# Patient Record
Sex: Female | Born: 1945 | Race: White | Hispanic: No | Marital: Married | Smoking: Former smoker
Health system: Southern US, Community
[De-identification: ages and names within clinical notes are randomized; demographics above are authoritative.]

## PROBLEM LIST (undated history)

## (undated) DIAGNOSIS — I10 Essential (primary) hypertension: Secondary | ICD-10-CM

## (undated) DIAGNOSIS — E119 Type 2 diabetes mellitus without complications: Secondary | ICD-10-CM

## (undated) DIAGNOSIS — E079 Disorder of thyroid, unspecified: Secondary | ICD-10-CM

## (undated) HISTORY — PX: ABDOMINAL HYSTERECTOMY: SHX81

---

## 2008-07-06 ENCOUNTER — Observation Stay (HOSPITAL_COMMUNITY): Admission: EM | Admit: 2008-07-06 | Discharge: 2008-07-07 | Payer: Self-pay | Admitting: Emergency Medicine

## 2008-07-06 ENCOUNTER — Ambulatory Visit: Payer: Self-pay | Admitting: Internal Medicine

## 2008-07-06 ENCOUNTER — Ambulatory Visit: Payer: Self-pay | Admitting: Cardiology

## 2008-07-07 ENCOUNTER — Encounter (INDEPENDENT_AMBULATORY_CARE_PROVIDER_SITE_OTHER): Payer: Self-pay | Admitting: Internal Medicine

## 2011-04-18 NOTE — H&P (Signed)
NAME:  Phyllis Chambers, Phyllis Chambers NO.:  [id number]   MEDICAL RECORD NO.:  [id number]          PATIENT TYPE:  EMS   LOCATION:  MAJO                         FACILITY:  [facility]   PHYSICIAN:  Ladell Pier, M.D.   DATE OF BIRTH:  Aug 14, 1946   DATE OF ADMISSION:  07/06/2008  DATE OF DISCHARGE:                              HISTORY & PHYSICAL   CHIEF COMPLAINT:  Tingling in the chest and the left arm.   HISTORY OF PRESENT ILLNESS:  The patient is a 65 year old white female.  She resides in [place], [place].  The patient presented today with  chest pain.  She states is not really a pain; it is just a tingling  sensation on the left side of the chest going down her left arm that  lasted for about a minute.  She had no associated shortness of breath,  no nausea, no vomiting, no diaphoresis.  This has not happened in the  past.  The chest pain tingling occurred while she was on her twelfth  load of laundry.  She just came up from [place] to help her parents,  and she has been cleaning and doing laundry and trying to help them out.  The patient states that it has been a very stressful time in her life  right now trying to help her parents.  During the time that she felt the  pain, then she felt lightheaded also, but she did not pass out.  She  took her blood sugar at the time.  It was 283 after she had eaten, and  in about an hour it went down to about 230.  She had an episode of  diarrhea also that has resolved.   PAST MEDICAL HISTORY:  1. Anxiety.  2. Hypothyroidism.  3. Goiter.  4. TAH-BSO.  5. Tonsillectomy and adenoidectomy.   FAMILY HISTORY:  Mother is alive.  She has diabetes and lung cancer.  Father is 62 years older, and father has diabetes, hypertension and high  cholesterol but no heart disease in her family.   SOCIAL HISTORY:  She smokes about a half a pack of cigarettes per day  for many years.  No alcohol use.  She lives in [place], [place].  Her parents  live here in [place].  She has a stepson.  She is  married.  She is not employed right now.   MEDICATIONS:  1. Citalopram 20 mg; she takes one-quarter daily.  2. Synthroid 88 mcg daily.   ALLERGIES:  None.   REVIEW OF SYSTEMS:  As per stated in HPI.   PHYSICAL EXAMINATION:  VITAL SIGNS:  Temperature 98.2, blood pressure  128/81, pulse of 80, respirations 18, pulse oximetry 99% on room air.  HEENT:  Head normocephalic, atraumatic.  Pupils reactive to light.  Throat without erythema.  CARDIOVASCULAR:  Regular rate and rhythm.  No murmurs, rubs or gallops.  LUNGS:  Clear bilaterally.  No wheezes, rhonchi or rales.  ABDOMEN:  Soft, nontender, nondistended.  Positive bowel sounds.  EXTREMITIES:  Without edema.  There were 2+ DP pulses bilaterally.  NEUROLOGIC:  Nonfocal.   LABORATORY DATA:  Sodium 137, potassium 4.2, chloride 105, CO2 of 105,  glucose 79, BUN 20, creatinine 0.9, lipase of 26.  LFTs are normal.  Urinalysis is normal.  The first set of cardiac markers revealed CK-MB  of 1.3, myoglobin 53, troponin less than 0.05.  Head CT showed no acute  or focal intracranial abnormality.  Chest x-ray shows bronchitic type  changes but no active air space disease.  EKG is normal.   ASSESSMENT/PLAN:  1. Chest pain, rule out.  2. Anxiety.  3. Hypothyroidism.  4. Near syncope.   Will admit the patient to a hospital room to rule out MI with serial  enzymes, EKG, 2-D echo.  Will check TSH and a D-dimer, as well.  Will  continue her on her Synthroid and check for hypothyroidism and continue  citalopram for anxiety.  Will give her a nicotine patch for smoking.      Ladell Pier, M.D.  Electronically Signed     NJ/MEDQ  D:  07/06/2008  T:  07/06/2008  Job:  [id number]

## 2011-04-18 NOTE — Consult Note (Signed)
NAME:  DALYLA, CHUI NO.:  [id number]   MEDICAL RECORD NO.:  [id number]          PATIENT TYPE:  OBV   LOCATION:  2009                         FACILITY:  [facility]   PHYSICIAN:  Bevelyn Buckles. Bensimhon, MDDATE OF BIRTH:  May 28, 1946   DATE OF CONSULTATION:  07/07/2008  DATE OF DISCHARGE:  07/07/2008                                 CONSULTATION   PRIMARY CARE PHYSICIAN:  Nelly Rout, MD, [facility], [place], [place]   PRIMARY CARDIOLOGIST:  New and she will follow up in [place].   CHIEF COMPLAINT:  Chest pain.   HISTORY OF PRESENT ILLNESS:  Ms. Schwimmer is a 65 year old female with no  history of coronary artery disease.  She has been under increased stress  secondary to caring for her aging parents as well as some other issues.  Yesterday, when she got up she stated her head did not feel right.  She had no specific complaints, but feels that her level of  consciousness was slightly altered.  She had a tingling and cramping  feeling in her left shoulder and left chest.  Her symptoms started  without exertion.  She states it reached to 1-2/10.  There was no  shortness of breath, nausea, vomiting or diaphoresis.  There were no  aggravating or alleviating symptoms.  Her symptoms resolved  spontaneously and when they recurred, EMS was called.  She stated that  they checked her blood pressure which was higher than normal and her CBG  was elevated as well.  She was transported to the hospital and admitted  for further evaluation.  Cardiac enzymes are negative and Cardiology was  asked to evaluate her.   PAST MEDICAL HISTORY:  1. Diabetes (diagnosed today).  2. Hyperlipidemia.  3. Ongoing tobacco use.  4. Obesity with a body mass index of 38.3.  5. Hypothyroidism.  6. Gastroesophageal reflux disease.  7. History of anxiety attack.   PAST SURGICAL HISTORY:  She is status post hysterectomy and  tonsillectomy.   ALLERGIES:  No known drug allergies.   MEDICATIONS PRIOR TO ADMISSION:  1. Synthroid 88 mcg a day.  2. Celexa 20 mg one-quarter tab daily.   CURRENT MEDICATIONS:  1. Aspirin 81 mg a day.  2. Celexa 5 mg daily.  3. Lovenox 40 mg subcu daily.  4. Glucotrol 5 mg daily.  5. Synthroid 88 mcg a day.  6. Nicotine patch 21 mg daily.  7. Protonix 40 mg a day.  8. Zocor 40 mg nightly.   SOCIAL HISTORY:  She lives in [place], [place] with her husband  Oswaldo Done.  She is currently unemployed.  She has an approximately 25-pack  year history of tobacco use, but she states she only smokes a few  cigarettes a day currently and denies alcohol or drug abuse.  She does  not exercise regularly and admits that she rarely exerts herself.  She  eats a poor diet.   FAMILY HISTORY:  Her mother is 63 and in poor health with possible  dementia but no heart disease and her father is alive at age  90 and is  increasingly frail, but also with no history of heart disease.  She has  one brother that died of cancer.   REVIEW OF SYSTEMS:  She has had no fevers, chills or sweats.  She has  not had any headaches or vision or hearing loss.  Her symptoms regarding  her head not feeling right had completely resolved.  The chest pain is  described above.  She has some chronic dyspnea on exertion that is not  changed recently.  She does not cough or wheeze.  She states that she  has severe diarrhea with greater than 5 episodes daily for 5 days prior  to admission, but no nausea and vomiting.  She did not see any blood in  diarrhea.  Her reflux symptoms are generally well controlled.  Full 14-  point review of systems is otherwise negative.   PHYSICAL EXAMINATION:  VITAL SIGNS:  Temperature is 97.5, blood pressure  131/79, pulse 75, respiratory rate 18, and O2 saturation 97% on room  air.  GENERAL:  She is a well-developed, well-nourished white female in no  acute distress.  HEENT:  Normal.  NECK:  There is no lymphadenopathy, thyromegaly, bruit or JVD  noted.  CV:  Her heart is regular in rate and rhythm with S1-S2 and no  significant murmur, rub or gallop is noted.  Distal pulses are intact in  all 4 extremities and no femoral bruits are appreciated.  LUNGS:  Have a few rales in the bases, but are essentially clear  bilaterally.  SKIN:  No rashes or lesions are noted.  ABDOMEN:  Soft and nontender with active bowel sounds and no  hepatosplenomegaly by palpation.  EXTREMITIES:  There is no cyanosis, clubbing, or edema noted.  Distal  pulses are intact in all 4 extremities and no femoral bruits are  appreciated.  MUSCULOSKELETAL:  There is no joint deformity or effusions and no spine  or CVA tenderness.  NEURO:  She is alert and oriented.  Cranial nerves II through XII  grossly intact.   Chest X-Ray:  Two-view bronchitic changes with no active airspace  disease.   Head CT:  No acute disease and no focal intracranial abnormality.   EKG:  Sinus rhythm, rate 72 with no acute ischemic changes.   LABORATORY VALUES:  Hemoglobin 12.9, hematocrit 38.3, WBC 6.8, and  platelets 272.  D-dimer less than 0.22.  Sodium 135, potassium 3.9,  chloride 104, CO2 26, BUN 6, creatinine 0.57, glucose 160, hemoglobin  A1c 9.9, total cholesterol 232, triglycerides 84, HDL 44, LDL 171.  CK-  MB and troponin I as well as point-of-care markers all negative for MI.   Echocardiogram:  EF 55-65%, no diagnostic left ventricular regional wall  motion abnormality identified, but the possibility cannot be completely  excluded based on images.  No significant valvular abnormalities.   IMPRESSION:  Ms. Staubs was seen today by Dr. Gala Romney.  Her chest pain  is atypical and her enzymes are negative for myocardial infarction.  An  outpatient Myoview is appropriate to evaluate her for ischemia.  It is  appropriate to have her on aspirin and statin and diabetes control  medications.  She is encouraged to continue to abstain from smoking.  Once she is cleared for  discharge by her primary care physician,  outpatient cardiac followup will be arranged.      Theodore Demark, PA-C      Bevelyn Buckles. Bensimhon, MD  Electronically Signed    RB/MEDQ  D:  07/07/2008  T:  07/08/2008  Job:  [id number]

## 2011-04-21 NOTE — Discharge Summary (Signed)
Phyllis Chambers, COSPER NO.:  [id number]   MEDICAL RECORD NO.:  [id number]          PATIENT TYPE:  OBV   LOCATION:  2009                         FACILITY:  [facility]   PHYSICIAN:  Ladell Pier, M.D.   DATE OF BIRTH:  24-Mar-1946   DATE OF ADMISSION:  07/06/2008  DATE OF DISCHARGE:  07/07/2008                               DISCHARGE SUMMARY   DISCHARGE DIAGNOSES:  1. Chest pain rule out myocardial infarction, enzymes negative.  The      patient will follow up as an outpatient with [facility] for      Myoview stress test.  2. Chronic renal failure.  3. Tobacco abuse.  4. Anxiety.  5. Hypothyroidism.  6. New diabetes.  7. Dyslipidemia.   DISCHARGE MEDICATIONS:  1. Aspirin 81 mg daily.  2. Synthroid 88 mcg daily.  3. Celexa 20 mg quarter tablet daily.  The patient left before getting      new medication for diabetes.   FOLLOWUP APPOINTMENT:  The patient will follow up as an outpatient with  Cardiolite stress test and will also follow up with her primary care  physician.   PROCEDURES:  None.   CONSULTANTS:  Cardiology.   HISTORY OF PRESENT ILLNESS:  The patient is a 65 year old female who  resides in [place], [place].  She presented with chest pain.  She  states it is not really pain.  It is just a tingling sensation on the  left side of her chest going down to left arm that lasted for about a  minute.  She has no associated shortness of breath.  Please see  admission note for remainder of the history.   PAST MEDICAL HISTORY/SOCIAL HISTORY/MEDICATIONS/ALLERGIES/REVIEW OF  SYSTEMS:  See H and P.   PHYSICAL EXAMINATION ON DISCHARGE:  VITAL SIGNS:  Temperature 97.5,  pulse 75, respiratory rate 18, blood pressure 131/79, and pulse ox 97%  on room air.  HEENT:  Head is normocephalic and atraumatic.  Pupils are reactive to  light.  Throat is without erythema.  CARDIAC:  Regular rate and rhythm.  LUNGS:  Clear bilaterally.  ABDOMEN:  Soft and  positive bowel sounds.  EXTREMITIES:  No edema.   HOSPITAL COURSE:  1. Chest pain.  The patient was admitted to the hospital.  Cardiac      enzymes were done that were negative.  D-dimer was negative.  EKG      did not show any acute ST segment elevation or depression.  The      patient was left before she could be seen to be officially      discharge.  She left AMA.  2. Dyslipidemia.  The patient was started on a statin in the hospital,      but did not get prescription for statin to go home withsecondary to      leaving AMA.  She will follow up with her primary care physician to      receive medications for elevated cholesterol.  3. Anxiety.  She was placed on Xanax p.r.n. while in the hospital.  4. New  onset of diabetes.  She was started on Glucotrol, which should      be switched to metformin in an outpatient; however, as mentioned      before, the patient left AMA, so did not get prescription for her      diabetes.  Her hemoglobin A1c during the admission was 9.9.  5. Hypothyroidism.  She will continue with her Synthroid.   DISCHARGE LABORATORY:  D-dimer less than 0.22.  Hemoglobin A1c 9.9.  Cardiac enzymes negative.  Total cholesterol 232, LDL 171, and HDL 44,  TSH 0.454.  Sodium 137, potassium 4.2, chloride 104, CO2 26, glucose  160, BUN 6, and creatinine 0.57.      Ladell Pier, M.D.  Electronically Signed     NJ/MEDQ  D:  08/05/2008  T:  08/06/2008  Job:  [id number]

## 2011-09-01 LAB — URINE CULTURE

## 2011-09-01 LAB — CBC
MCV: 83.6
RBC: 4.57
WBC: 6.8

## 2011-09-01 LAB — URINALYSIS, ROUTINE W REFLEX MICROSCOPIC
Bilirubin Urine: NEGATIVE
Glucose, UA: NEGATIVE
Hgb urine dipstick: NEGATIVE
Protein, ur: NEGATIVE
Specific Gravity, Urine: 1.005

## 2011-09-01 LAB — BASIC METABOLIC PANEL
CO2: 26
Chloride: 104
Creatinine, Ser: 0.57
GFR calc Af Amer: >60

## 2011-09-01 LAB — POCT I-STAT, CHEM 8
BUN: 20
Calcium, Ion: 1.18
Creatinine, Ser: 0.9
Glucose, Bld: 79
TCO2: 23

## 2011-09-01 LAB — B-NATRIURETIC PEPTIDE (CONVERTED LAB): Pro B Natriuretic peptide (BNP): <30

## 2011-09-01 LAB — LIPID PANEL
Cholesterol: 232 — ABNORMAL HIGH
LDL Cholesterol: 171 — ABNORMAL HIGH
Triglycerides: 84

## 2011-09-01 LAB — TSH: TSH: 0.569

## 2011-09-01 LAB — CARDIAC PANEL(CRET KIN+CKTOT+MB+TROPI): Relative Index: INVALID

## 2011-09-01 LAB — HEMOGLOBIN A1C: Mean Plasma Glucose: 275

## 2011-09-01 LAB — D-DIMER, QUANTITATIVE: D-Dimer, Quant: <0.22

## 2011-09-01 LAB — HEPATIC FUNCTION PANEL
Bilirubin, Direct: 0.1
Indirect Bilirubin: 0.6
Total Protein: 6.5

## 2011-09-01 LAB — LIPASE, BLOOD: Lipase: 26

## 2014-10-03 ENCOUNTER — Emergency Department (HOSPITAL_COMMUNITY)
Admission: EM | Admit: 2014-10-03 | Discharge: 2014-10-03 | Disposition: A | Discharge status: Home / Self Care | Payer: Medicare Other | Attending: Emergency Medicine | Admitting: Emergency Medicine

## 2014-10-03 ENCOUNTER — Encounter (HOSPITAL_COMMUNITY): Payer: Self-pay | Admitting: Emergency Medicine

## 2014-10-03 ENCOUNTER — Emergency Department (HOSPITAL_COMMUNITY): Payer: Medicare Other

## 2014-10-03 DIAGNOSIS — Y9389 Activity, other specified: Secondary | ICD-10-CM | POA: Diagnosis not present

## 2014-10-03 DIAGNOSIS — Z79899 Other long term (current) drug therapy: Secondary | ICD-10-CM | POA: Diagnosis not present

## 2014-10-03 DIAGNOSIS — I10 Essential (primary) hypertension: Secondary | ICD-10-CM | POA: Insufficient documentation

## 2014-10-03 DIAGNOSIS — Y9289 Other specified places as the place of occurrence of the external cause: Secondary | ICD-10-CM | POA: Insufficient documentation

## 2014-10-03 DIAGNOSIS — W19XXXA Unspecified fall, initial encounter: Secondary | ICD-10-CM

## 2014-10-03 DIAGNOSIS — S4991XA Unspecified injury of right shoulder and upper arm, initial encounter: Secondary | ICD-10-CM | POA: Diagnosis present

## 2014-10-03 DIAGNOSIS — Z87891 Personal history of nicotine dependence: Secondary | ICD-10-CM | POA: Insufficient documentation

## 2014-10-03 DIAGNOSIS — E079 Disorder of thyroid, unspecified: Secondary | ICD-10-CM | POA: Diagnosis not present

## 2014-10-03 DIAGNOSIS — E119 Type 2 diabetes mellitus without complications: Secondary | ICD-10-CM | POA: Diagnosis not present

## 2014-10-03 DIAGNOSIS — M25531 Pain in right wrist: Secondary | ICD-10-CM | POA: Insufficient documentation

## 2014-10-03 DIAGNOSIS — M79621 Pain in right upper arm: Secondary | ICD-10-CM | POA: Insufficient documentation

## 2014-10-03 DIAGNOSIS — W1839XA Other fall on same level, initial encounter: Secondary | ICD-10-CM | POA: Diagnosis not present

## 2014-10-03 DIAGNOSIS — S42331A Displaced oblique fracture of shaft of humerus, right arm, initial encounter for closed fracture: Secondary | ICD-10-CM | POA: Diagnosis not present

## 2014-10-03 DIAGNOSIS — S42301A Unspecified fracture of shaft of humerus, right arm, initial encounter for closed fracture: Secondary | ICD-10-CM

## 2014-10-03 HISTORY — DX: Type 2 diabetes mellitus without complications: E11.9

## 2014-10-03 HISTORY — DX: Disorder of thyroid, unspecified: E07.9

## 2014-10-03 HISTORY — DX: Essential (primary) hypertension: I10

## 2014-10-03 MED ORDER — FENTANYL CITRATE 0.05 MG/ML IJ SOLN
50.0000 ug | Freq: Once | INTRAMUSCULAR | Status: AC
  Administered 2014-10-03: 50 ug via INTRAVENOUS
  Filled 2014-10-03: qty 2

## 2014-10-03 MED ORDER — OXYCODONE-ACETAMINOPHEN 5-325 MG PO TABS: 2.0000 | ORAL_TABLET | Freq: Four times a day (QID) | ORAL | Status: AC | PRN

## 2014-10-03 MED ORDER — MORPHINE SULFATE 4 MG/ML IJ SOLN
4.0000 mg | Freq: Once | INTRAMUSCULAR | Status: AC
  Administered 2014-10-03: 4 mg via INTRAVENOUS
  Filled 2014-10-03: qty 1

## 2014-10-03 MED ORDER — ONDANSETRON HCL 4 MG/2ML IJ SOLN
4.0000 mg | Freq: Once | INTRAMUSCULAR | Status: AC
  Administered 2014-10-03: 4 mg via INTRAVENOUS
  Filled 2014-10-03: qty 2

## 2014-10-03 NOTE — ED Notes (Signed)
Right upper mid arm shortening. Patient being sent to X-ray now.

## 2014-10-03 NOTE — ED Notes (Signed)
Awake. Verbally responisve. Resp even and unlabored. ABC's intact. No discoloration noted. Noted deformity/swelling. Family at bedside.

## 2014-10-03 NOTE — ED Notes (Signed)
Bed: WA08 Expected date: 10/03/14 Expected time: 5:10 PM Means of arrival: Ambulance Comments: Fall, arm deformity

## 2014-10-03 NOTE — Discharge Instructions (Signed)
Humerus Fracture (Thrower's Fracture) °with Rehab °A humerus fracture is a break (fracture) of the bone of the upper arm (humerus). Humerus fractures may be complete or incomplete. This document does not discuss humerus fractures that involve the elbow or shoulder joints. °SYMPTOMS  °· Severe pain in the arm, at the time of injury. °· Tenderness and swelling over the fracture site. °· Bruising (contusion) of the arm within 48 hours of injury. °· Later, swelling and bruising in the elbow and hand. °· Visible deformity, if the bone fragments are out of alignment (displaced). °· Numbness, coldness, or paralysis below the fracture, from pressure on or stretching of blood vessels or nerves (uncommon). °CAUSES  °· Direct hit (trauma) to the upper arm. °· Indirect trauma (i.e. falling on an outstretched hand or violent muscle contraction). °· Possibly, throwing hard enough to produce twisting force. °RISK INCREASES WITH: °· Contact sports (i.e. football, rugby). °· Sports requiring violent arm muscle contraction (i.e. wrestling). °· Sports requiring arm twisting (i.e. throwing sports). °· Children younger than 12 years of age, adults older than 60. °· History of bone or joint disease (i.e. osteoporosis, bone tumor). °· Previous restraint of the arm. °· Poor arm and shoulder strength and flexibility. °PREVENTION  °· Warm up and stretch properly before activity. °· Maintain physical fitness: °¨ Strength, flexibility, and endurance. °¨ Cardiovascular fitness. °· Wear properly fitted and padded protective equipment, when appropriate. °PROGNOSIS  °If treated properly, humerus fractures often heal within 6 to 8 weeks in adults and 4 to 6 weeks in children.  °RELATED COMPLICATIONS  °· Fracture fails to heal (nonunion). °· Fracture heals in a poor position (malunion). °· Chronic pain, stiffness, loss of motion, or swelling of the shoulder or elbow. °· Excessive bleeding in the arm, causing pressure and injury to nerves and blood  vessels (uncommon). °· Calcium deposits in the soft tissues (heterotopic ossification). °· Injury to the nerves of the hand or wrist due to stretching from the fracture, causing numbness, weakness, or paralysis. °· Shortening of the arm. °TREATMENT  °Treatment first involves the use of ice and medicine, to reduce pain and inflammation. If the bone fragments are out of alignment (displaced), immediate realigning of the bone (reduction) by a medically trained person is required. Fractures that cannot be realigned by hand, or are open (bones poke through the skin) may require surgery to hold the fracture in place with screws, pins, and plates. Once the bones are properly aligned, restraint of the upper arm, elbow, and shoulder is required for at least 6 weeks, to allow for healing. After restraint, it is important to perform strengthening and stretching exercises to help regain strength and a full range of motion. These exercises may be completed at home or with a therapist.  °MEDICATION  °· If pain medicine is needed, nonsteroidal anti-inflammatory medicines (aspirin and ibuprofen), or other minor pain relievers (acetaminophen), are often advised. °· Do not take pain medicine for 7 days before surgery. °· Prescription pain relievers may be given if your caregiver thinks they are needed. Use only as directed and only as much as you need. °COLD THERAPY  °Cold treatment (icing) should be applied for 10 to 15 minutes every 2 to 3 hours for inflammation and pain, and immediately after activity that aggravates your symptoms. Use ice packs or an ice massage. °SEEK MEDICAL CARE IF:  °· Pain, tenderness, or swelling gets worse, despite treatment. °· You experience pain, numbness, or coldness in the hand. °· Blue, gray, or dark   color appears in the fingernails. °· Any of the following occur after surgery: fever, increased pain, swelling, redness, drainage of fluids, or bleeding in the affected area. °· New, unexplained symptoms  develop. (Drugs used in treatment may produce side effects.) °EXERCISES  °RANGE OF MOTION (ROM) AND STRETCHING EXERCISES - Humerus Fracture (Thrower's Fracture) °These exercises may help you restore your elbow motion, once your physician has discontinued your restraint period. Your symptoms may resolve with or without further involvement from your physician, physical therapist or athletic trainer. While completing these exercises, remember:  °· Restoring tissue flexibility helps normal motion to return to the joints. This allows healthier, less painful movement and activity. °· An effective stretch should be held for at least 30 seconds. °· A stretch should never be painful. You should only feel a gentle lengthening or release in the stretched tissue. °ROM - Pendulum  °· Bend at the waist so that your right / left arm falls away from your body. Support yourself with your opposite hand on a solid surface, such as a table or a countertop. °· Your right / left arm should be perpendicular to the ground. If it is not perpendicular, you need to lean over farther. Relax the muscles in your right / left arm and shoulder as much as possible. °· Gently sway your hips and trunk so they move your right / left arm, without use of your right / left shoulder muscles. °· Progress your movements, so that your right / left arm moves side to side, then forward and backward, and finally, both clockwise and counterclockwise. °· Complete __________ repetitions in each direction. Many people use this exercise to relieve discomfort in their shoulder, as well as to gain range of motion. °Repeat __________ times. Complete this exercise __________ times per day. °RANGE OF MOTION - Extension °· Hold your right / left arm at your side and straighten your elbow as far as you can using your right / left arm muscles. °· Straighten the right / left elbow farther, by gently pushing down on your forearm until you feel a gentle stretch on the inside  of your elbow. Hold this position for __________ seconds. °· Slowly return to the starting position. °Repeat __________ times. Complete this exercise __________ times per day.  °RANGE OF MOTION - Flexion °· Hold your right / left arm at your side and bend your elbow as far as you can using your right / left arm muscles. °· Bend the right / left elbow farther, by gently pushing up on your forearm until you feel a gentle stretch on the outside of your elbow. Hold this position for __________ seconds. °· Slowly return to the starting position. °Repeat __________ times. Complete this exercise __________ times per day.  °STRETCH - Flexion, Seated  °· Sit in a firm chair, so that your right / left forearm can rest on a table or countertop. Your right / left elbow should rest below the height of your shoulder, so that your shoulder feels supported and not tense or uncomfortable. °· Keeping your right / left shoulder relaxed, lean forward at your waist, allowing your right / left hand to slide forward. Bend forward until you feel a moderate stretch in your shoulder, but before you feel an increase in your pain. °· Hold for __________ seconds. Slowly return to your starting position. °Repeat __________ times. Complete this exercise __________ times per day.  °STRETCH - Flexion, Standing  °· Stand with good posture. With an underhand grip   on your right / left hand, and an overhand grip on the opposite hand, grasp a broomstick or cane so that your hands are a little more than shoulder width apart. °· Keeping your right / left elbow straight and shoulder muscles relaxed, push the stick with your opposite hand to raise your right / left arm in front of your body and then overhead. Raise your arm until you feel a stretch in your right / left shoulder, but before you have increased shoulder pain. °· Try to avoid shrugging your right / left shoulder as your arm rises, by keeping your shoulder blade tucked down and toward your  mid-back spine. Hold for __________ seconds. °· Slowly return to the starting position. °Repeat __________ times. Complete this exercise __________ times per day.  °STRETCH - Abduction, Supine  °· Lie on your back. With an underhand grip on your right / left hand, and an overhand grip on the opposite hand, grasp a broomstick or cane so that your hands are a little more than shoulder width apart. °· Keeping your right / left elbow straight and shoulder muscles relaxed, push the stick with your opposite hand to raise your right / left arm out to the side of your body and then overhead. Raise your arm until you feel a stretch in your right / left shoulder, but before you have increased shoulder pain. °· Try to avoid shrugging your right / left shoulder as your arm rises by keeping your shoulder blade tucked down and toward your mid-back spine. Hold for __________ seconds. °· Slowly return to the starting position. °Repeat __________ times. Complete this exercise __________ times per day.  °ROM - Flexion, Active-Assisted °· Lie on your back. You may bend your knees for comfort. °· Grasp a broomstick or cane so your hands are about shoulder width apart. Your right / left hand should grip the end of the stick, so that your hand is positioned "thumbs-up," as if you were about to shake hands. °· Using your healthy arm to lead, raise your right / left arm overhead until you feel a gentle stretch in your shoulder. Hold for __________ seconds. °· Use the stick to assist in returning your right / left arm to its starting position. °Repeat __________ times. Complete this exercise __________ times per day.  °STRETCH - Flexion, Standing  °· Stand facing a wall. Walk your right / left fingers up the wall, until you feel a moderate stretch in your shoulder. As your hand gets higher, you may need to step closer to the wall or use a door frame to walk through. °· Try to avoid shrugging your right / left shoulder as your arm rises, by  keeping your shoulder blade tucked down and toward your mid-back spine. °· Hold for __________ seconds. Use your other hand, if needed, to ease out of the stretch and return to the starting position. °Repeat __________ times. Complete this exercise __________ times per day.  °STRETCH - External Rotation °· Tuck a folded towel or small ball under your right / left upper arm. Grasp a broomstick or cane with an underhand grasp, a little more than shoulder width apart. Bend your elbows to 90 degrees. °· Stand with good posture or sit in a firm chair without arms. °· Use your healthy arm to push the stick across your body. Do not allow the towel or ball to fall. This will rotate your right / left arm away from your abdomen. Using the stick, turn or   rotate your hand and forearm away from your body. Hold for __________ seconds. °Repeat __________ times. Complete this exercise __________ times per day.  °STRENGTHENING EXERCISES - Humerus Fracture (Thrower's Fracture) °These exercises may help you when beginning to rehabilitate your injury. They may resolve your symptoms with or without further involvement from your physician, physical therapist or athletic trainer. While completing these exercises, remember:  °· Muscles can gain both the endurance and the strength needed for everyday activities through controlled exercises. °· Complete these exercises as instructed by your physician, physical therapist or athletic trainer. Increase the resistance and repetitions only as guided. °· You may experience muscle soreness or fatigue, but the pain or discomfort you are trying to eliminate should never worsen during these exercises. If this pain does get worse, stop and make certain you are following the directions exactly. If the pain is still present after adjustments, discontinue the exercise until you can discuss the trouble with your clinician. °STRENGTH - Shoulder Abductors, Isometric  °· With good posture, stand or sit about  4-6 inches from a wall, with your right / left side facing the wall. °· Bend your right / left elbow. Gently press your right / left elbow into the wall. Increase the pressure gradually, until you are pressing as hard as you can without shrugging your shoulder or increasing any shoulder discomfort. °· Hold for __________ seconds. °· Release the tension slowly. Relax your shoulder muscles completely before you begin the next repetition. °Repeat __________ times. Complete this exercise __________ times per day.  °STRENGTH - Shoulder Flexion, Isometric  °· With good posture, stand or sit about 4-6 inches from a wall, with your right / left side facing the wall. °· Keeping your right / left elbow straight, gently press the top of your fist into the wall. Increase the pressure gradually, until you are pressing as hard as you can without shrugging your shoulder or increasing any shoulder discomfort. °· Hold for __________ seconds. °· Release the tension slowly. Relax your shoulder muscles completely before you begin the next repetition. °Repeat __________ times. Complete this exercise __________ times per day.  °STRENGTH - Internal Rotators, Isometric  °· Keep your right / left elbow at your side and bend it 90 degrees. °· Step into a door frame so that the inside of your right / left wrist can press against the door frame, without your upper arm leaving your side. °· Gently press your right / left wrist into the door frame, as if you were trying to draw the palm of your hand to your abdomen. Gradually increase the tension, until you are pressing as hard as you can without shrugging your shoulder or increasing any shoulder discomfort. °· Hold for __________ seconds. °· Release the tension slowly. Relax your shoulder muscles completely before you begin the next repetition. °Repeat __________ times. Complete this exercise __________ times per day.  °STRENGTH - External Rotators, Isometric  °· Keep your right / left elbow at  your side and bend it 90 degrees. °· Step into a door frame so that the outside of your right / left wrist can press against the door frame without your upper arm leaving your side. °· Gently press your right / left wrist into the door frame, as if you were trying to swing the back of your hand away from your abdomen. Gradually increase the tension, until you are pressing as hard as you can without shrugging your shoulder or increasing any shoulder discomfort. °· Hold for   __________ seconds. °· Release the tension slowly. Relax your shoulder muscles completely before you begin the next repetition. °Repeat __________ times. Complete this exercise __________ times per day.  °STRENGTH - Shoulder Flexion  °· Stand or sit with good posture. Grasp a __________ weight, or an exercise band or tubing, so that your right / left hand is "thumbs-up," like you are shaking hands. °· Slowly lift your right / left arm as far as you can, without increasing any shoulder pain. At first, many people can only raise their hand to shoulder height. °· Avoid shrugging your right / left shoulder as your arm rises, by keeping your shoulder blade tucked down and toward your mid-back spine. °· Hold for__________ seconds. Control your hand as you slowly return to your starting position. °Repeat __________ times. Complete this exercise __________ times per day.  °STRENGTH - Shoulder Abduction  °· Stand or sit with good posture. Grasp a __________ weight, or an exercise band or tubing, so that your hand is "thumbs-up," like you are shaking hands. °· Slowly lift your right / left arm out to the side, to shoulder height or as far as you can, without increasing any shoulder pain. You must have specific instruction from your caregiver in order to raise your hand above shoulder height. °· Avoid shrugging your right / left shoulder as your arm rises, by keeping your shoulder blade tucked down and toward your mid-back spine. °· Hold for __________  seconds. Control your hand as you slowly return to your starting position. °Repeat __________ times. Complete this exercise __________ times per day.  °STRENGTH - External Rotators  °· Secure a rubber exercise band or tubing to a fixed object (table, pole) so that it is at the same height as your right / left elbow when you are standing or sitting on a firm surface. °· Stand or sit so that the secured exercise band is on your healthy side. °· Bend your right / left elbow 90 degrees. Place a folded towel or small pillow under your right / left arm, so that your elbow is a few inches away from your side. °· Keeping the tension on the exercise band, pull it away from your body, as if pivoting on your elbow. Be sure to keep your body steady, so that the movement is only coming from your rotating shoulder. °· Hold for __________ seconds. Release the tension in a controlled manner as you return to the starting position. °Repeat __________ times. Complete this exercise __________ times per day.  °STRENGTH - Internal Rotators  °· Secure a rubber exercise band or tubing to a fixed object (table, pole) so that it is at the same height as your right / left elbow when you are standing or sitting on a firm surface. °· Stand or sit so that the secured exercise band is on your right / left side. °· Bend your right / left elbow 90 degrees. Place a folded towel or small pillow under your right / left arm so that your elbow is a few inches away from your side. °· Keeping the tension on the exercise band, pull it across your body toward your abdomen. Be sure to keep your body steady, so that the movement is only coming from your rotating shoulder. °· Hold for __________ seconds. Release the tension in a controlled manner as you return to the starting position. °Repeat __________ times. Complete this exercise __________ times per day.  °Document Released: 11/20/2005 Document Revised: 02/12/2012 Document Reviewed: 03/04/2009 °  [employer]®  Patient Information ©2015 [employer]. This information is not intended to replace advice given to you by your health care provider. Make sure you discuss any questions you have with your health care provider. ° °

## 2014-10-03 NOTE — ED Notes (Signed)
MD at bedside. 

## 2014-10-03 NOTE — ED Notes (Signed)
Per EMS- in bathtub, went to step out. Larey SeatFell forward and put arm down to catch fall. Has mid upper arm deformity. C/o 10/10 pain. HR 80 BP 130 palpated 150 mcg Fentanyl given 150 cc NS given BP 108/68 at lowest. Pain 6/10. Distal right radial pulses palpated. Able to move fingers. Allergic to NSAIDs. Not on blood thinners.

## 2014-10-03 NOTE — ED Notes (Signed)
Orthropedic tech at bedside to splint rt humerus fx.

## 2014-10-03 NOTE — ED Provider Notes (Signed)
CSN: [id number]     Arrival date & time 10/03/14  1740 History   First MD Initiated Contact with Patient 10/03/14 1829     Chief Complaint  Patient presents with  . Arm Injury    right     (Consider location/radiation/quality/duration/timing/severity/associated sxs/prior Treatment) HPI Comments: Patient presents to emergency department with chief complaint of fall. 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 She landed on her right arm. She complains of pain to the upper arm and to the wrist. She states pain is 6 out of 10. She has been given fentanyl with good relief. She denies any sensory deficits, but states that the pain is worsened with palpation and movement. She did not hit her head or lose consciousness. She denies any other symptoms at this time.  The history is provided by the patient. No language interpreter was used.    Past Medical History  Diagnosis Date  . Diabetes mellitus without complication   . Thyroid disease   . Hypertension    Past Surgical History  Procedure Laterality Date  . Abdominal hysterectomy     History reviewed. No pertinent family history. History  Substance Use Topics  . Smoking status: Former Smoker -- 0.75 packs/day    Types: Cigarettes  . Smokeless tobacco: Not on file  . Alcohol Use: No   OB History   Grav Para Term Preterm Abortions TAB SAB Ect Mult Living                 Review of Systems  Constitutional: Negative for fever and chills.  Respiratory: Negative for shortness of breath.   Cardiovascular: Negative for chest pain.  Gastrointestinal: Negative for nausea, vomiting, diarrhea and constipation.  Genitourinary: Negative for dysuria.  Musculoskeletal: Positive for arthralgias and joint swelling.  All other systems reviewed and are negative.     Allergies  Review of patient's allergies indicates no known allergies.  Home Medications   Prior to Admission medications   Medication Sig Start Date End  Date Taking? Authorizing Provider  Calcium Carbonate-Vitamin D (CALCIUM-VITAMIN D) 500-200 MG-UNIT per tablet Take 1 tablet by mouth 2 (two) times daily.   Yes Historical Provider, MD  citalopram (CELEXA) 20 MG tablet Take 10 mg by mouth daily.   Yes Historical Provider, MD  levothyroxine (SYNTHROID, LEVOTHROID) 50 MCG tablet Take 50 mcg by mouth daily before breakfast.   Yes Historical Provider, MD  lisinopril (PRINIVIL,ZESTRIL) 5 MG tablet Take 5 mg by mouth.   Yes Historical Provider, MD  metFORMIN (GLUCOPHAGE) 500 MG tablet Take 500 mg by mouth 2 (two) times daily with a meal.   Yes Historical Provider, MD  pravastatin (PRAVACHOL) 40 MG tablet Take 40 mg by mouth at bedtime.   Yes Historical Provider, MD   BP 129/63  Pulse 78  Temp(Src) 97.9 F (36.6 C) (Oral)  Resp 16  SpO2 98% Physical Exam  Nursing note and vitals reviewed. Constitutional: She is oriented to person, place, and time. She appears well-developed and well-nourished.  HENT:  Head: Normocephalic and atraumatic.  Eyes: Conjunctivae and EOM are normal. Pupils are equal, round, and reactive to light.  Neck: Normal range of motion. Neck supple.  Cardiovascular: Normal rate, regular rhythm and intact distal pulses.  Exam reveals no gallop and no friction rub.   No murmur heard. Intact distal pulses with brisk capillary refill  Pulmonary/Chest: Effort normal and breath sounds normal. No respiratory distress. She has no wheezes. She has no rales. She exhibits  no tenderness.  Abdominal: Soft. Bowel sounds are normal. She exhibits no distension and no mass. There is no tenderness. There is no rebound and no guarding.  Musculoskeletal: Normal range of motion. She exhibits no edema and no tenderness.  Right upper arm tenderness palpation, upper arm is swollen, probable hematoma, no skin tenting, range of motion strength limited secondary to pain, normal grip strength, normal sensation of the distal arm  Neurological: She is alert  and oriented to person, place, and time.  Sensation intact No wrist drop  Skin: Skin is warm and dry.  Psychiatric: She has a normal mood and affect. Her behavior is normal. Judgment and thought content normal.    ED Course  Procedures (including critical care time) Labs Review Labs Reviewed - No data to display  Imaging Review Dg Shoulder Right  10/03/2014   CLINICAL DATA:  Fall in bathtub with arm pain  EXAM: RIGHT SHOULDER - 2+ VIEW  COMPARISON:  None.  FINDINGS: The humeral head is well located. The clavicle and scapular within normal limits. An oblique fractures through noted through the midshaft of the right humerus. No other focal abnormality is noted.  IMPRESSION: Midshaft humeral fracture.  No acute abnormality is noted.   Electronically Signed   By: Alcide CleverMark  Lukens M.D.   On: 10/03/2014 18:35   Dg Humerus Right  10/03/2014   CLINICAL DATA:  Patient fell in bathtub  EXAM: RIGHT HUMERUS - 2+ VIEW  COMPARISON:  None.  FINDINGS: Frontal and lateral views were obtained. There is an obliquely oriented fracture at the junction of the proximal and mid thirds of the humerus with anterior displacement of the distal fracture fragment with respect to the proximal fragment. No other fractures. No dislocation. Joint spaces appear intact.  IMPRESSION: Displaced, obliquely oriented fracture of the junction of the proximal and mid thirds of the humerus. No dislocation.   Electronically Signed   By: Bretta BangWilliam  Woodruff M.D.   On: 10/03/2014 18:35     EKG Interpretation None     SPLINT APPLICATION Date/Time: 8:23 PM Authorized by: Roxy HorsemanBROWNING, Ailyne Pawley Consent: Verbal consent obtained. Risks and benefits: risks, benefits and alternatives were discussed Consent given by: patient Splint applied by: Orthopedic technician Location details: Right upper arm Splint type: coaptation Supplies used: ACE, fiberglass, sling Post-procedure: The splinted body part was neurovascularly unchanged following the  procedure. Patient tolerance: Patient tolerated the procedure well with no immediate complications.    MDM   Final diagnoses:  Fall  Humerus fracture, right, closed, initial encounter    Patient with right humerus fracture. No wrist drop or signs of radial palsy. He intact brachial and distal pulses. Sensation and strength intact distally.  Patient seen by and discussed with Dr. Romeo AppleHarrison.  Discussed the patient with Dr. Shon BatonBrooks, who recommends coaptation splint.  Will splint, treat pain, and discharge.  Dr. Shon BatonBrooks offered to see the patient after he finishes surgery, but states this is not mandatory. Plan for discharge now.  Patient and husband understand and agree with the plan.       Roxy Horsemanobert Malikhi Ogan, PA-C 10/03/14 2024

## 2014-10-03 NOTE — ED Notes (Signed)
Patient transported to X-ray 

## 2014-10-03 NOTE — Consult Note (Signed)
Notified by Er about patient Films reviewed By report this is a closed fracture No evidence of compartment syndrome  No evidence of radial nerve injury By report patient has no focal neuro deficits Recommend coaptation splint and sling Offered to evaluate patient after I completed OR case Patient declined and was discharged to home Instructed to follow up Monday [place].

## 2015-08-11 IMAGING — CR DG WRIST COMPLETE 3+V*R*
4 series · 4 of 4 positions shown · non-contrast
Comparison: None.

CLINICAL DATA: Tripped over a bathtub with wrist pain

EXAM:
RIGHT WRIST - COMPLETE 3+ VIEW

[x wrist pa right]
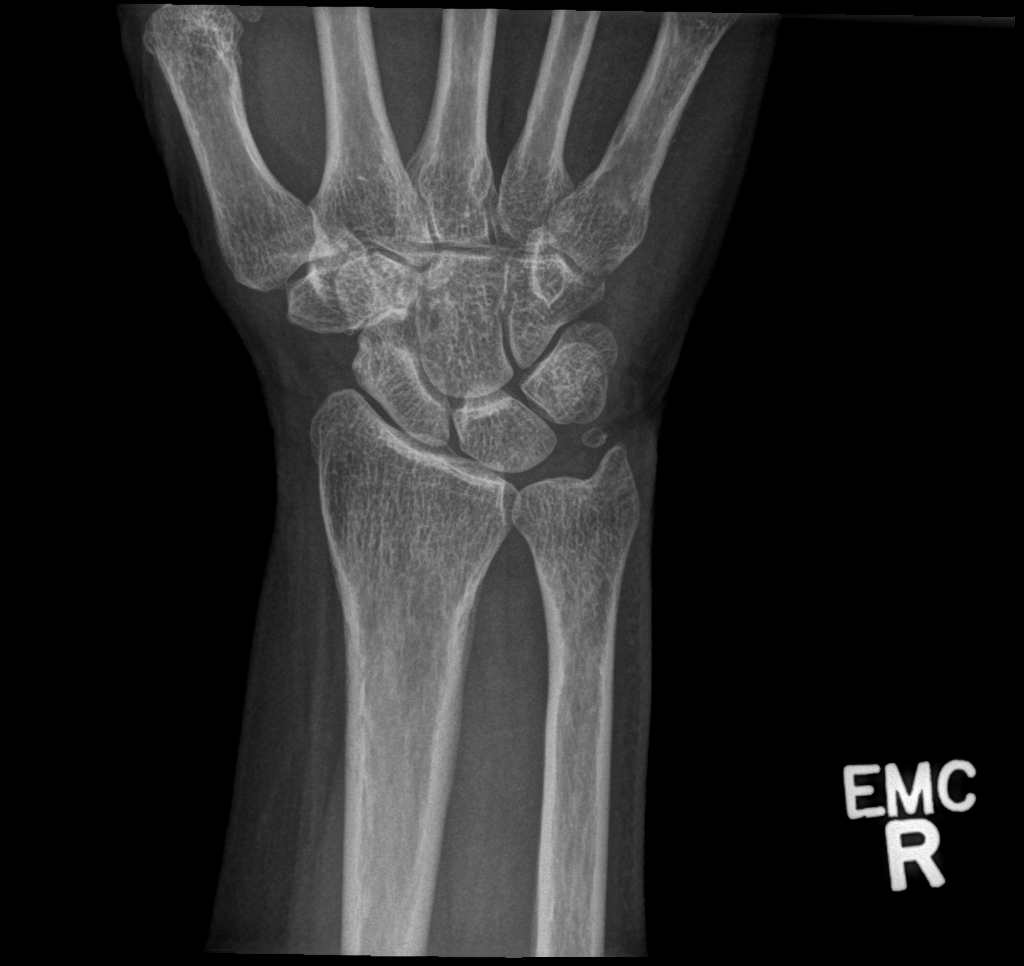

[x wrist obl right]
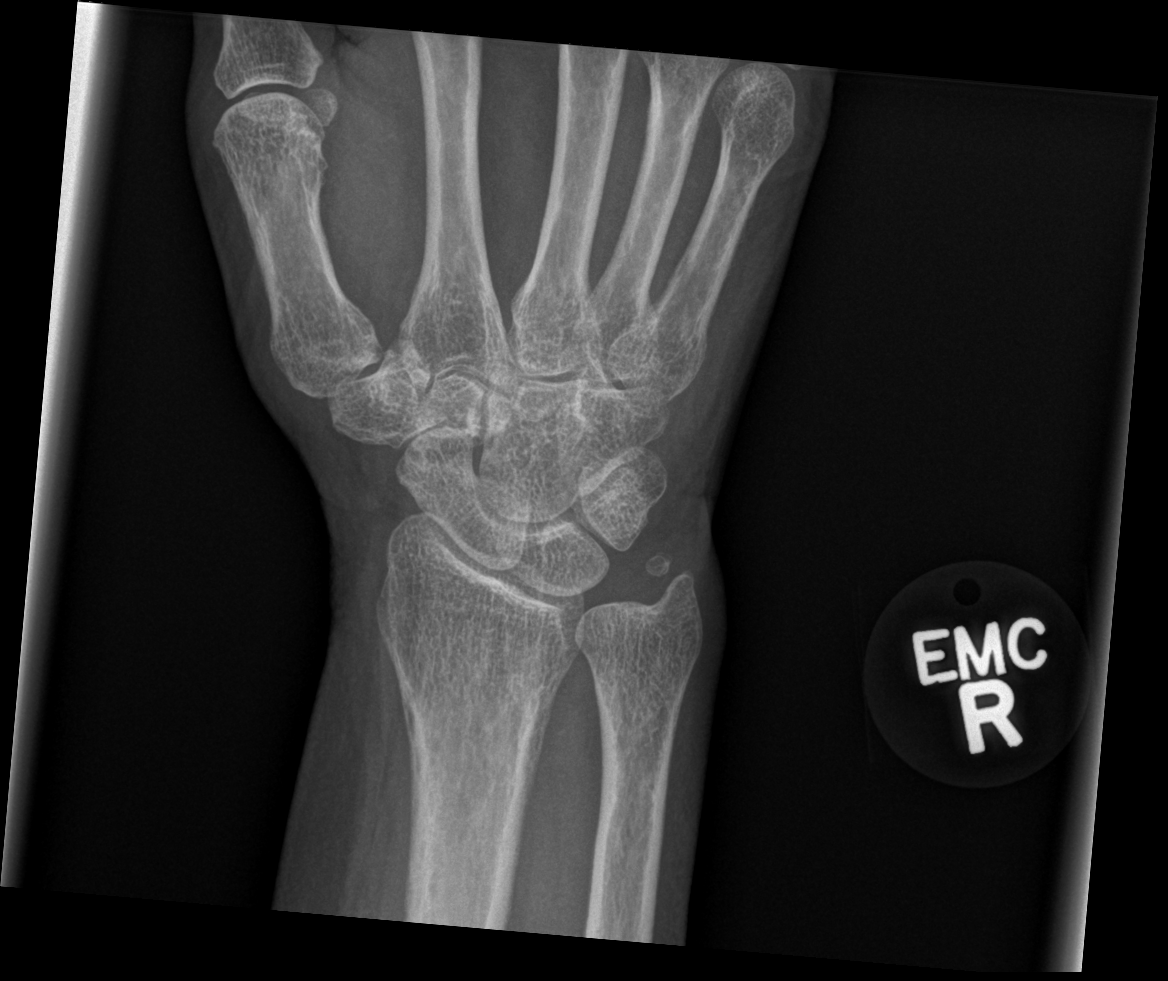

[x wrist lat right]
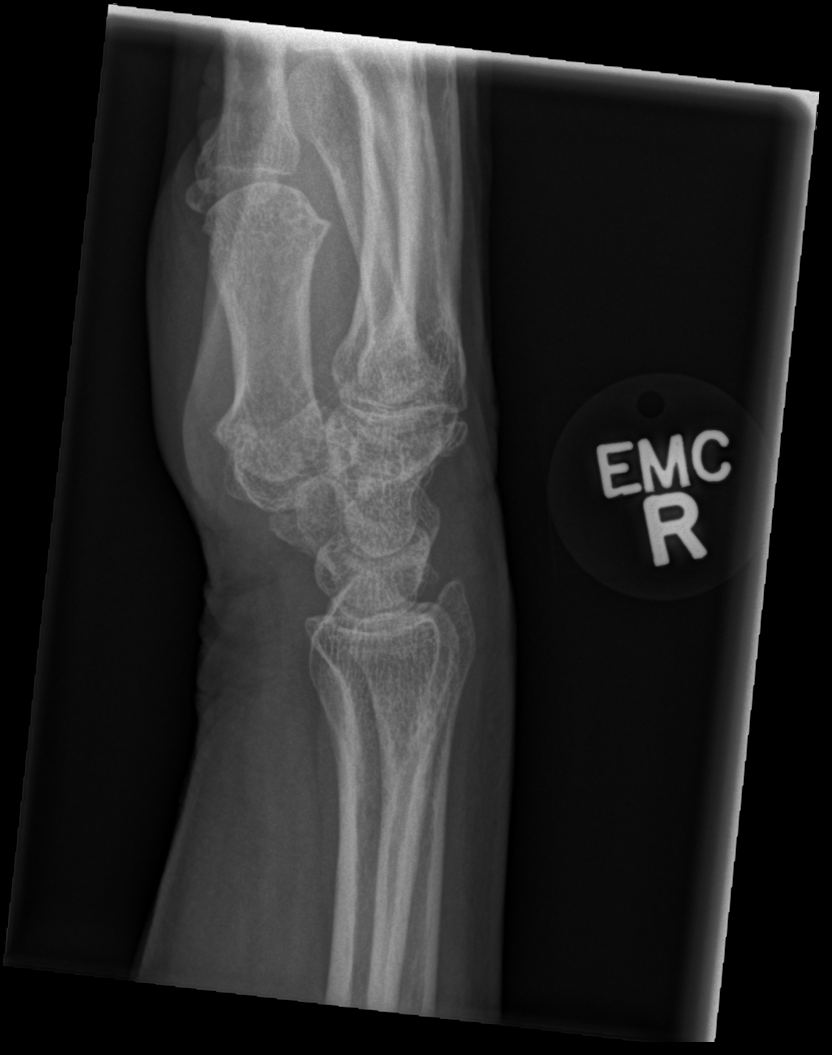

[x wrist navicular view right]
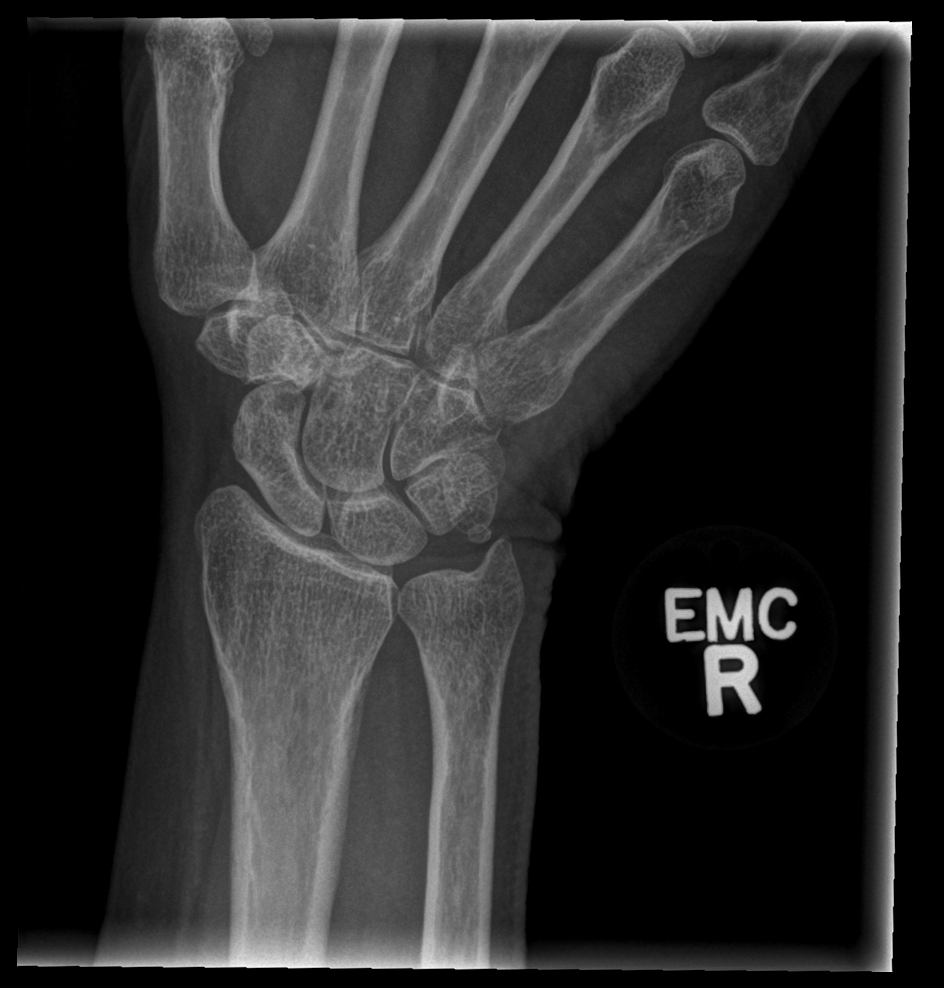

[4 of 4 positions shown; findings below may reference images not displayed]

FINDINGS: Generalized osteopenia is noted. No acute fracture or dislocation is
noted. No soft tissue changes are noted.
IMPRESSION: No acute abnormality noted.
